# Patient Record
Sex: Male | Born: 1955 | Race: Black or African American | Hispanic: No | Marital: Single | Smoking: Current every day smoker
Health system: Southern US, Community
[De-identification: ages and names within clinical notes are randomized; demographics above are authoritative.]

## PROBLEM LIST (undated history)

## (undated) DIAGNOSIS — R339 Retention of urine, unspecified: Secondary | ICD-10-CM

## (undated) DIAGNOSIS — F17200 Nicotine dependence, unspecified, uncomplicated: Secondary | ICD-10-CM

## (undated) DIAGNOSIS — N138 Other obstructive and reflux uropathy: Secondary | ICD-10-CM

## (undated) DIAGNOSIS — R972 Elevated prostate specific antigen [PSA]: Secondary | ICD-10-CM

---

## 2004-10-03 ENCOUNTER — Emergency Department: Payer: Self-pay | Admitting: Emergency Medicine

## 2005-04-05 ENCOUNTER — Emergency Department: Payer: Self-pay | Admitting: Emergency Medicine

## 2005-12-21 ENCOUNTER — Emergency Department: Payer: Self-pay | Admitting: Emergency Medicine

## 2005-12-28 ENCOUNTER — Emergency Department: Payer: Self-pay | Admitting: Emergency Medicine

## 2006-01-03 ENCOUNTER — Emergency Department: Payer: Self-pay | Admitting: Emergency Medicine

## 2007-05-29 IMAGING — CR DG RIBS 2V*R*
1 series · 3 of 3 positions shown · non-contrast
Comparison: none

REASON FOR EXAM: RIGHT rib pain /injury
COMMENTS:

PROCEDURE:     DXR - DXR RIBS RIGHT UNILATERAL  - January 03, 2006  [DATE]
RESULT:          No evidence of displaced rib fracture or pneumothorax.

[Series 1: view not recorded · 0.17mm/px · 3 of 3 slices shown]
[im 1/3]
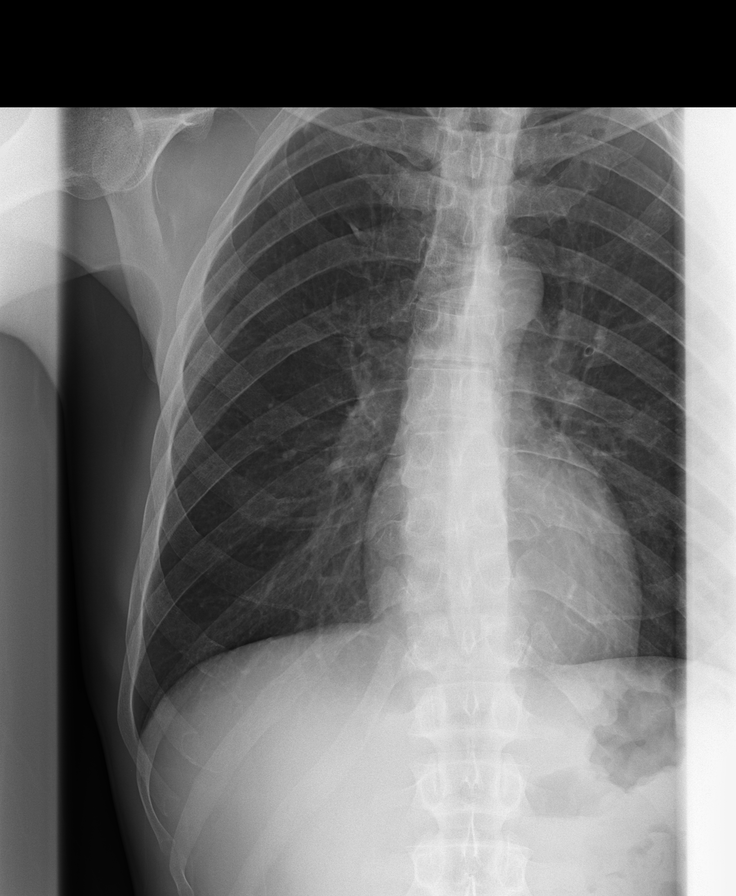
[im 2/3]
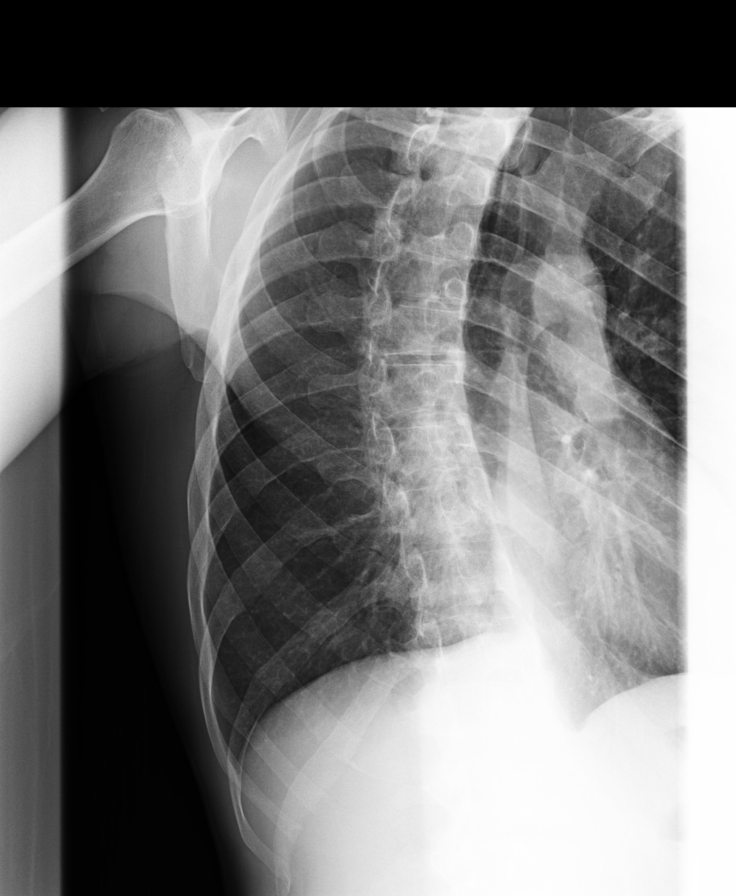
[im 3/3]
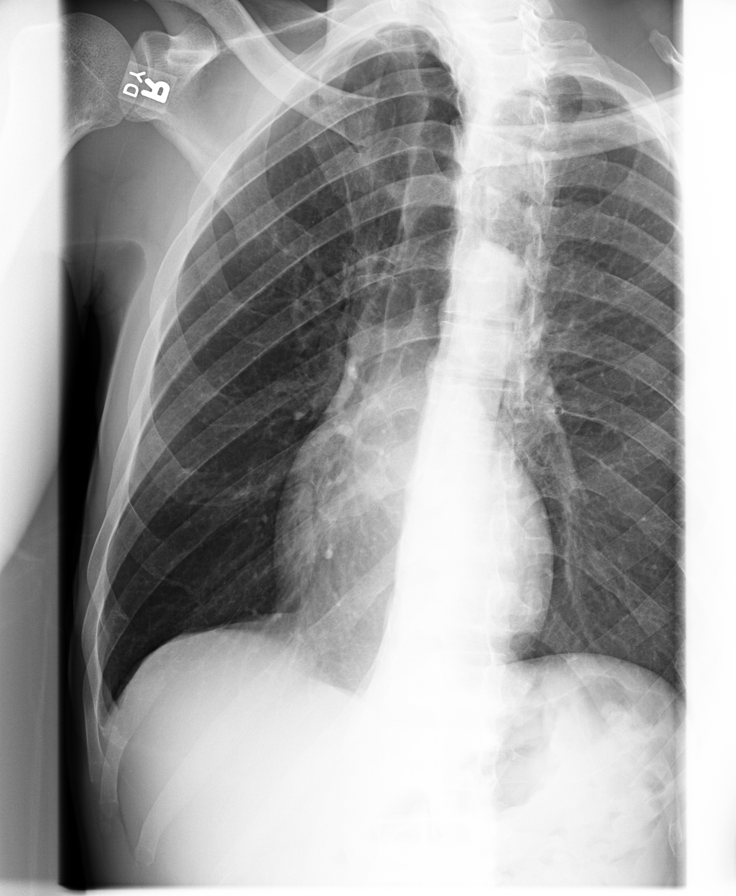

[3 of 3 positions shown; findings below may reference images not displayed]

IMPRESSION: No acute significant abnormality is identified.  No
evidence of displaced rib fracture or pneumothorax.

## 2015-07-17 ENCOUNTER — Emergency Department
Admission: EM | Admit: 2015-07-17 | Discharge: 2015-07-17 | Disposition: A | Discharge status: Home / Self Care | Payer: Self-pay | Attending: Emergency Medicine | Admitting: Emergency Medicine

## 2015-07-17 DIAGNOSIS — N611 Abscess of the breast and nipple: Secondary | ICD-10-CM

## 2015-07-17 DIAGNOSIS — L02213 Cutaneous abscess of chest wall: Secondary | ICD-10-CM | POA: Insufficient documentation

## 2015-07-17 DIAGNOSIS — Z720 Tobacco use: Secondary | ICD-10-CM | POA: Insufficient documentation

## 2015-07-17 MED ORDER — SULFAMETHOXAZOLE-TRIMETHOPRIM 800-160 MG PO TABS: 1.0000 | ORAL_TABLET | Freq: Two times a day (BID) | ORAL | Status: DC

## 2015-07-17 MED ORDER — IBUPROFEN 800 MG PO TABS: 800.0000 mg | ORAL_TABLET | Freq: Three times a day (TID) | ORAL | Status: AC | PRN

## 2015-07-17 MED ORDER — TRAMADOL HCL 50 MG PO TABS: 50.0000 mg | ORAL_TABLET | Freq: Four times a day (QID) | ORAL | Status: AC | PRN

## 2015-07-17 NOTE — Discharge Instructions (Signed)
Apply warm compresses to area twice a day.

## 2015-07-17 NOTE — ED Notes (Signed)
Pt c/o small red , raised swollen area to the right nipple for the past 2 days

## 2015-07-17 NOTE — ED Provider Notes (Signed)
[facility] Emergency Department Provider Note  ____________________________________________  Time seen: Approximately 12:55 PM  I have reviewed the triage vital signs and the nursing notes.   HISTORY  Chief Complaint Abscess    HPI Shane Fitzpatrick is a 59 y.o. male complaining of  small red swollen area to the right nipple for 2 days.Patient stated there is no discharge from this area. Stated dull pain. No provocative or palliative measures. Patient denies any fever or chills.   History reviewed. No pertinent past medical history.  There are no active problems to display for this patient.   History reviewed. No pertinent past surgical history.  No current outpatient prescriptions on file.  Allergies Review of patient's allergies indicates no known allergies.  No family history on file.  Social History History  Substance Use Topics  . Smoking status: Current Every Day Smoker -- 0.50 packs/day    Types: Cigarettes  . Smokeless tobacco: Never Used  . Alcohol Use: No    Review of Systems Constitutional: No fever/chills Eyes: No visual changes. ENT: No sore throat. Cardiovascular: Denies chest pain. Respiratory: Denies shortness of breath. Gastrointestinal: No abdominal pain.  No nausea, no vomiting.  No diarrhea.  No constipation. Genitourinary: Negative for dysuria. Musculoskeletal: Negative for back pain. Skin: Papular lesion left breast Neurological: Negative for headaches, focal weakness or numbness. 10-point ROS otherwise negative.  ____________________________________________   PHYSICAL EXAM:  VITAL SIGNS: ED Triage Vitals  Enc Vitals Group     BP 07/17/15 1137 160/84 mmHg     Pulse Rate 07/17/15 1137 72     Resp 07/17/15 1137 18     Temp 07/17/15 1137 98.2 F (36.8 C)     Temp Source 07/17/15 1137 Oral     SpO2 07/17/15 1137 99 %     Weight 07/17/15 1137 145 lb (65.772 kg)     Height 07/17/15 1137  (1.702 m)   Head Cir --      Peak Flow --      Pain Score 07/17/15 1138 0     Pain Loc --      Pain Edu? --      Excl. in GC? --     Constitutional: Alert and oriented. Well appearing and in no acute distress. Eyes: Conjunctivae are normal. PERRL. EOMI. Head: Atraumatic. Nose: No congestion/rhinnorhea. Mouth/Throat: Mucous membranes are moist.  Oropharynx non-erythematous. Neck: No stridor.  No cervical spine tenderness to palpation. Hematological/Lymphatic/Immunilogical: No cervical lymphadenopathy. Cardiovascular: Normal rate, regular rhythm. Grossly normal heart sounds.  Good peripheral circulation. Elevated systolic blood pressure. Respiratory: Normal respiratory effort.  No retractions. Lungs CTAB. Gastrointestinal: Soft and nontender. No distention. No abdominal bruits. No CVA tenderness. Musculoskeletal: No lower extremity tenderness nor edema.  No joint effusions. Neurologic:  Normal speech and language. No gross focal neurologic deficits are appreciated. No gait instability. Skin:  Erythematous papular lesion right breast. Area is nonfluctuant. Mild and induration.  Psychiatric: Mood and affect are normal. Speech and behavior are normal.  ____________________________________________   LABS (all labs ordered are listed, but only abnormal results are displayed)  Labs Reviewed - No data to display ____________________________________________  EKG   ____________________________________________  RADIOLOGY   ____________________________________________   PROCEDURES  Procedure(s) performed: None  Critical Care performed: No  ____________________________________________   INITIAL IMPRESSION / ASSESSMENT AND PLAN / ED COURSE  Pertinent labs & imaging results that were available during my care of the patient were reviewed by me and considered in my medical decision making (  see chart for details).  Abscess right breast.area is nonfluctuant at this time. Discussed the patient  rationale from the I&D at this time. Patient was placed on Bactrim tramadol and ibuprofen. Patient get advised on wound care. Patient advised return back in 3 days sooner if his condition worsens.  ____________________________________________   FINAL CLINICAL IMPRESSION(S) / ED DIAGNOSES  Final diagnoses:  Abscess of right breast      Joni Reining, PA-C 07/17/15 1305  Phineas Semen, MD 07/17/15 [phone]

## 2022-05-12 ENCOUNTER — Other Ambulatory Visit: Payer: Self-pay

## 2022-05-12 DIAGNOSIS — R1110 Vomiting, unspecified: Secondary | ICD-10-CM | POA: Diagnosis not present

## 2022-05-12 DIAGNOSIS — R109 Unspecified abdominal pain: Secondary | ICD-10-CM | POA: Insufficient documentation

## 2022-05-12 DIAGNOSIS — R300 Dysuria: Secondary | ICD-10-CM | POA: Diagnosis not present

## 2022-05-12 DIAGNOSIS — R339 Retention of urine, unspecified: Secondary | ICD-10-CM | POA: Insufficient documentation

## 2022-05-12 LAB — CBC
HCT: 42.6 % (ref 39.0–52.0)
Hemoglobin: 14.3 g/dL (ref 13.0–17.0)
MCH: 32.7 pg (ref 26.0–34.0)
MCHC: 33.6 g/dL (ref 30.0–36.0)
MCV: 97.5 fL (ref 80.0–100.0)
Platelets: 247 10*3/uL (ref 150–400)
RBC: 4.37 MIL/uL (ref 4.22–5.81)
RDW: 11.9 % (ref 11.5–15.5)
WBC: 12.9 10*3/uL — ABNORMAL HIGH (ref 4.0–10.5)
nRBC: 0 % (ref 0.0–0.2)

## 2022-05-12 LAB — COMPREHENSIVE METABOLIC PANEL
ALT: 15 U/L (ref 0–44)
AST: 26 U/L (ref 15–41)
Albumin: 4 g/dL (ref 3.5–5.0)
Alkaline Phosphatase: 74 U/L (ref 38–126)
Anion gap: 8 (ref 5–15)
BUN: 15 mg/dL (ref 8–23)
CO2: 22 mmol/L (ref 22–32)
Calcium: 9 mg/dL (ref 8.9–10.3)
Chloride: 108 mmol/L (ref 98–111)
Creatinine, Ser: 1.26 mg/dL — ABNORMAL HIGH (ref 0.61–1.24)
GFR, Estimated: >60 mL/min (ref >60)
Glucose, Bld: 154 mg/dL — ABNORMAL HIGH (ref 70–99)
Potassium: 3.5 mmol/L (ref 3.5–5.1)
Sodium: 138 mmol/L (ref 135–145)
Total Bilirubin: 1 mg/dL (ref 0.3–1.2)
Total Protein: 8.4 g/dL — ABNORMAL HIGH (ref 6.5–8.1)

## 2022-05-12 LAB — LIPASE, BLOOD: Lipase: 30 U/L (ref 11–51)

## 2022-05-12 MED ORDER — OXYCODONE-ACETAMINOPHEN 5-325 MG PO TABS
1.0000 | ORAL_TABLET | ORAL | Status: DC | PRN
  Administered 2022-05-12: 1 via ORAL
  Filled 2022-05-12: qty 1

## 2022-05-12 NOTE — ED Triage Notes (Signed)
Pt presents via POV c/o lower abd pain started last PM. Reports N/V. Reports difficultly urinating with dysuria.

## 2022-05-13 ENCOUNTER — Emergency Department
Admission: EM | Admit: 2022-05-13 | Discharge: 2022-05-13 | Disposition: A | Discharge status: Home / Self Care | Payer: No Typology Code available for payment source | Attending: Emergency Medicine | Admitting: Emergency Medicine

## 2022-05-13 DIAGNOSIS — R339 Retention of urine, unspecified: Secondary | ICD-10-CM

## 2022-05-13 LAB — URINALYSIS, ROUTINE W REFLEX MICROSCOPIC
Bilirubin Urine: NEGATIVE
Glucose, UA: NEGATIVE mg/dL
Hgb urine dipstick: NEGATIVE
Ketones, ur: NEGATIVE mg/dL
Leukocytes,Ua: NEGATIVE
Nitrite: NEGATIVE
Protein, ur: NEGATIVE mg/dL
Specific Gravity, Urine: 1.02 (ref 1.005–1.030)
pH: 5 (ref 5.0–8.0)

## 2022-05-13 MED ORDER — LIDOCAINE HCL URETHRAL/MUCOSAL 2 % EX GEL
1.0000 "application " | Freq: Once | CUTANEOUS | Status: AC
  Administered 2022-05-13: 1 via URETHRAL
  Filled 2022-05-13: qty 10

## 2022-05-13 NOTE — Discharge Instructions (Signed)
Take the tamsulosin once a day as prescribed.  Call urology in the morning for an appointment within the week.  Please follow the below instructions for your Foley catheter   Catheter care  Always wash your hands before and after touching your catheter.  Check the area around the urethra for inflammation or signs of infection. Signs of infection include irritated, swollen, red, or tender skin, or pus around the catheter.  Clean the area around the catheter with soap and water two times a day. Dry with a clean towel afterward.  Do not apply powder or lotion to the skin around the catheter.  To empty the urine collection bag  Wash your hands with soap and water.  Without touching the drain spout, remove the spout from its sleeve at the bottom of the collection bag. Open the valve on the spout.  Let the urine flow out of the bag and into the toilet or a container. Do not let the tubing or drain spout touch anything.  After you empty the bag, clean the end of the drain spout with tissue and water. Close the valve and put the drain spout back into its sleeve at the bottom of the collection bag.  Wash your hands with soap and water.

## 2022-05-13 NOTE — ED Notes (Signed)
Pt having lower abd pain with inability to urinate. Pt has been diaphoretic and vomiting. Pt's pain was 10/10. Post cath, pt has no pain.

## 2022-05-13 NOTE — ED Provider Notes (Signed)
[facility] Provider Note    Event Date/Time   First MD Initiated Contact with Patient 05/13/22 [phone]     (approximate)   History   Abdominal Pain   HPI  Shane Fitzpatrick is a 66 y.o. male who presents for evaluation of abdominal pain and urinary retention.  Patient started with dysuria and suprapubic abdominal pain yesterday.  Today has been unable to urinate.  Went to urgent care and was given Flomax and Levaquin without any blood work or urinalysis done.  Went home and started vomiting.  He is currently complaining of 10 out of 10 pain in the suprapubic region.  Denies chest pain or shortness of breath.  Patient has not seen a doctor in a very long time and therefore has no known medical problems.  No back pain, saddle anesthesia, lower extremity weakness or numbness   PMH None   Physical Exam   Triage Vital Signs: ED Triage Vitals  Enc Vitals Group     BP 05/12/22 2013 (!) 228/94     Pulse Rate 05/12/22 2013 72     Resp 05/12/22 2013 16     Temp 05/12/22 2013 98.8 F (37.1 C)     Temp Source 05/12/22 2013 Oral     SpO2 05/12/22 2013 99 %     Weight 05/12/22 2010 145 lb (65.8 kg)     Height 05/12/22 2010 5\' 9"  (1.753 m)     Head Circumference --      Peak Flow --      Pain Score 05/12/22 2010 7     Pain Loc --      Pain Edu? --      Excl. in GC? --     Most recent vital signs: Vitals:   05/13/22 0151 05/13/22 0153  BP:  (!) 160/83  Pulse:  72  Resp: 18 18  Temp: 98.1 F (36.7 C) 98.1 F (36.7 C)  SpO2:  100%     Constitutional: Alert and oriented. Well appearing and in no apparent distress. HEENT:      Head: Normocephalic and atraumatic.         Eyes: Conjunctivae are normal. Sclera is non-icteric.       Mouth/Throat: Mucous membranes are moist.       Neck: Supple with no signs of meningismus. Cardiovascular: Regular rate and rhythm. No murmurs, gallops, or rubs. 2+ symmetrical distal pulses are present in all extremities.   Respiratory: Normal respiratory effort. Lungs are clear to auscultation bilaterally.  Gastrointestinal: Soft, bladder is distended with tenderness on palpation, and non distended with positive bowel sounds. No rebound or guarding. Genitourinary: No CVA tenderness. Musculoskeletal:  No edema, cyanosis, or erythema of extremities. Neurologic: Normal speech and language. Face is symmetric. Moving all extremities. No gross focal neurologic deficits are appreciated. Skin: Skin is warm, dry and intact. No rash noted. Psychiatric: Mood and affect are normal. Speech and behavior are normal.  ED Results / Procedures / Treatments   Labs (all labs ordered are listed, but only abnormal results are displayed) Labs Reviewed  COMPREHENSIVE METABOLIC PANEL - Abnormal; Notable for the following components:      Result Value   Glucose, Bld 154 (*)    Creatinine, Ser 1.26 (*)    Total Protein 8.4 (*)    All other components within normal limits  CBC - Abnormal; Notable for the following components:   WBC 12.9 (*)    All other components within normal limits  URINALYSIS,  ROUTINE W REFLEX MICROSCOPIC - Abnormal; Notable for the following components:   Color, Urine YELLOW (*)    APPearance CLEAR (*)    All other components within normal limits  URINE CULTURE  LIPASE, BLOOD     EKG  none   RADIOLOGY none   PROCEDURES:  Critical Care performed: No  Procedures    IMPRESSION / MDM / ASSESSMENT AND PLAN / ED COURSE  I reviewed the triage vital signs and the nursing notes.  66 y.o. male who presents for evaluation of abdominal pain and urinary retention.  On exam patient is extremely hypertensive but otherwise no significant distress.  Bladder is distended on palpation.  Bladder scan showing 671 cc.  No prior history of urinary retention.  Patient did have some dysuria prior to retention.  Ddx: UTI versus BPH versus prostate cancer   Plan: Will place foley catheter. Will get CBC, BMP,  UA   MEDICATIONS GIVEN IN ED: Medications  oxyCODONE-acetaminophen (PERCOCET/ROXICET) 5-325 MG per tablet 1 tablet (1 tablet Oral Given 05/12/22 2024)  lidocaine (XYLOCAINE) 2 % jelly 1 application. (1 application. Urethral Given 05/13/22 0155)     ED COURSE: Bladder scan showing 671 cc.  A 16 French coud catheter was placed with decompression of the bladder.  Patient's pain and hypertension resolved.  UA with no evidence of UTI.  Patient has already received a prescription for Flomax from urgent care.  Recommended taking that every day.  We did discuss Foley care with patient.  Referral to urology in a week for further evaluation.  Discussed my standard return precautions with patient and both his daughters.  With no signs of UTI, sepsis, acute kidney injury there is no indication for admission   Consults: None   EMR reviewed none    FINAL CLINICAL IMPRESSION(S) / ED DIAGNOSES   Final diagnoses:  Urinary retention     Rx / DC Orders   ED Discharge Orders          Ordered    Ambulatory referral to Urology       Comments: New urinary retention now with a foley catheter, needs catheter removal and trial of void   05/13/22 0210             Note:  This document was prepared using Dragon voice recognition software and may include unintentional dictation errors.   Please note:  Patient was evaluated in Emergency Department today for the symptoms described in the history of present illness. Patient was evaluated in the context of the global COVID-19 pandemic, which necessitated consideration that the patient might be at risk for infection with the SARS-CoV-2 virus that causes COVID-19. Institutional protocols and algorithms that pertain to the evaluation of patients at risk for COVID-19 are in a state of rapid change based on information released by regulatory bodies including the CDC and federal and state organizations. These policies and algorithms were followed during the  patient's care in the ED.  Some ED evaluations and interventions may be delayed as a result of limited staffing during the pandemic.       Nita Sickle, MD 05/13/22 [phone]

## 2022-05-14 LAB — URINE CULTURE: Culture: NO GROWTH

## 2022-05-27 ENCOUNTER — Ambulatory Visit: Payer: Self-pay | Admitting: Urology

## 2022-05-27 NOTE — Progress Notes (Incomplete)
   05/27/22 5:33 AM   Laren Everts May 10, 1956 [id number]  Referring provider:  Nita Sickle, MD [address] [place],  [place] [address] No chief complaint on file.     HPI: Shane Fitzpatrick is a 66 y.o.malewho presents today for further evaluation of urinary retention   He was seen in the ED on 05/13/2022. He presented with abdominal pain and urinary retention. He went to urgent care and was prescribed Flomax and Levaquin. UA was unremarkable and urine culture showed no growth. His PVR showed 671 cc, he was cathter with a 16 FR coude with decompression.     PMH: No past medical history on file.  Surgical History: No past surgical history on file.  Home Medications:  Allergies as of 05/27/2022   No Known Allergies      Medication List        Accurate as of May 27, 2022  5:33 AM. If you have any questions, ask your nurse or doctor.          ibuprofen 800 MG tablet Commonly known as: ADVIL Take 1 tablet (800 mg total) by mouth every 8 (eight) hours as needed for moderate pain.   sulfamethoxazole-trimethoprim 800-160 MG tablet Commonly known as: BACTRIM DS Take 1 tablet by mouth 2 (two) times daily.        Allergies:  No Known Allergies  Family History: No family history on file.  Social History:  reports that he has been smoking cigarettes. He has been smoking an average of .5 packs per day. He has never used smokeless tobacco. He reports that he does not drink alcohol and does not use drugs.   Physical Exam: There were no vitals taken for this visit.  Constitutional:  Alert and oriented, No acute distress. HEENT: [place] AT, moist mucus membranes.  Trachea midline, no masses. Cardiovascular: No clubbing, cyanosis, or edema. Respiratory: Normal respiratory effort, no increased work of breathing. Skin: No rashes, bruises or suspicious lesions. Neurologic: Grossly intact, no focal deficits, moving all 4 extremities. Psychiatric: Normal mood  and affect.  Laboratory Data:  Lab Results  Component Value Date   CREATININE 1.26 (H) 05/12/2022   No results found for: "HGBA1C"  Urinalysis   Pertinent Imaging:    Assessment & Plan:     No follow-ups on file.  I,Kailey Littlejohn,acting as a Neurosurgeon for Vanna Scotland, MD.,have documented all relevant documentation on the behalf of Vanna Scotland, MD,as directed by  Vanna Scotland, MD while in the presence of Vanna Scotland, MD.   [facility] [address], [address] [place], [place] [address] [phone]

## 2022-05-30 ENCOUNTER — Encounter: Payer: Self-pay | Admitting: Urology

## 2022-06-04 NOTE — H&P (Signed)
NAMEDECLIN, RAJAN MEDICAL RECORD NO: [id number] ACCOUNT NO: [id number] DATE OF BIRTH: [birth year] FACILITY: [facility] LOCATION: [facility] PHYSICIAN: Suszanne Conners. Evelene Croon, MD  History and Physical   DATE OF ADMISSION: 06/12/2022  Same day surgery 07/06.  CHIEF COMPLAINT:  Urinary retention.  HISTORY OF PRESENT ILLNESS:  Mr. Ethington is a 66 year old African-American male who developed acute urinary retention and had a Foley catheter placed in the Emergency Room on 06/5th.  He was started on tamsulosin at that time.  He was given a voiding  trial, which was unsuccessful 1 week later and we increased his tamsulosin to 0.4 mg b.i.d. and added finasteride.  Since that time, he has failed 2 additional voiding trials and now comes in for photovaporization of prostate with GreenLight laser.  He  was found to have a 59.9 mL prostate by ultrasound.  He was also noted to have a PSA of 12.3 ng/mL on 06/13th.  PAST MEDICAL HISTORY: ALLERGIES:  No drug allergies.  CURRENT MEDICATIONS:  Tamsulosin and finasteride.  PAST SURGICAL HISTORY:  Negative.  PAST AND CURRENT MEDICAL CONDITIONS:  Negative.  REVIEW OF SYSTEMS:  The patient denies chest pain, shortness of breath, diabetes, stroke or heart disease.  SOCIAL HISTORY:  The patient smokes a pack a day and has a 40-pack-year history.  He denied alcohol use.  FAMILY HISTORY:  Father died at age 55 of unknown reasons.  Mother died at age 42 of unknown reasons.  He has a brother, age 68 with BPH.  There is no family history of prostate cancer.  PHYSICAL EXAMINATION: VITAL SIGNS:  Height was 5 feet 7 inches, weight 139 pounds, BMI 22. GENERAL:  Well-nourished African-American male in no acute distress. HEENT:  Sclerae were clear.  Pupils are equally round, reactive to light and accommodation.  Extraocular movements are intact. NECK:  No palpable cervical masses. LYMPHATICS:  No palpable cervical or inguinal adenopathy. PULMONARY:  Lungs clear to  auscultation. CARDIOVASCULAR:  Regular rhythm and rate. ABDOMEN:  Soft, nontender abdomen. GENITOURINARY:  Circumcised.  Testes smooth, nontender, approximately 18 mL size each. RECTAL EXAM: 50 gram, smooth, nontender prostate, otherwise unremarkable. NEUROMUSCULAR:  Nonfocal.  IMPRESSION: 1.  Urinary retention. 2.  Significant BPH with obstruction. 3.  Elevated PSA.  PLAN:  Photovaporization of prostate with GreenLight laser.   NIK D: 06/03/2022 4:59:27 pm T: 06/03/2022 6:50:00 pm  JOB: [id number]/ [id number]

## 2022-06-06 ENCOUNTER — Other Ambulatory Visit: Payer: Self-pay

## 2022-06-06 ENCOUNTER — Encounter
Admission: RE | Admit: 2022-06-06 | Discharge: 2022-06-06 | Disposition: A | Discharge status: Home / Self Care | Payer: No Typology Code available for payment source | Source: Ambulatory Visit | Attending: Urology | Admitting: Urology

## 2022-06-06 DIAGNOSIS — Z01812 Encounter for preprocedural laboratory examination: Secondary | ICD-10-CM

## 2022-06-06 HISTORY — DX: Elevated prostate specific antigen (PSA): R97.20

## 2022-06-06 HISTORY — DX: Retention of urine, unspecified: R33.9

## 2022-06-06 HISTORY — DX: Nicotine dependence, unspecified, uncomplicated: F17.200

## 2022-06-06 HISTORY — DX: Other obstructive and reflux uropathy: N13.8

## 2022-06-06 NOTE — Patient Instructions (Addendum)
Your procedure is scheduled on: 06/12/22 - Thursday Report to the Registration Desk on the 1st floor of the [facility]. To find out your arrival time, please call [phone] between 1PM - 3PM on: 06/11/22 - Wednesday If your arrival time is 6:00 am, do not arrive prior to that time as the [facility] entrance doors do not open until 6:00 am.  REMEMBER: Instructions that are not followed completely may result in serious medical risk, up to and including death; or upon the discretion of your surgeon and anesthesiologist your surgery may need to be rescheduled.  Do not eat food after midnight the night before surgery.  No gum chewing, lozengers or hard candies.  You may however, drink CLEAR liquids up to 2 hours before you are scheduled to arrive for your surgery. Do not drink anything within 2 hours of your scheduled arrival time.  Clear liquids include: - water  - apple juice without pulp - gatorade (not RED colors) - black coffee or tea (Do NOT add milk or creamers to the coffee or tea) Do NOT drink anything that is not on this list.  TAKE THESE MEDICATIONS THE MORNING OF SURGERY WITH A SIP OF WATER: NONE  One week prior to surgery: Stop Anti-inflammatories (NSAIDS) such as Advil, Aleve, Ibuprofen, Motrin, Naproxen, Naprosyn and Aspirin based products such as Excedrin, Goodys Powder, BC Powder.  Stop ANY OVER THE COUNTER supplements until after surgery.  You may to take Tylenol if needed for pain up until the day of surgery.  No Alcohol for 24 hours before or after surgery.  No Smoking including e-cigarettes for 24 hours prior to surgery.  No chewable tobacco products for at least 6 hours prior to surgery.  No nicotine patches on the day of surgery.  Do not use any "recreational" drugs for at least a week prior to your surgery.  Please be advised that the combination of cocaine and anesthesia may have negative outcomes, up to and including death. If you test positive for  cocaine, your surgery will be cancelled.  On the morning of surgery brush your teeth with toothpaste and water, you may rinse your mouth with mouthwash if you wish. Do not swallow any toothpaste or mouthwash.  Do not wear jewelry, make-up, hairpins, clips or nail polish.  Do not wear lotions, powders, or perfumes.   Do not shave body from the neck down 48 hours prior to surgery just in case you cut yourself which could leave a site for infection.  Also, freshly shaved skin may become irritated if using the CHG soap.  Contact lenses, hearing aids and dentures may not be worn into surgery.  Do not bring valuables to the hospital. [facility] is not responsible for any missing/lost belongings or valuables.   Notify your doctor if there is any change in your medical condition (cold, fever, infection).  Wear comfortable clothing (specific to your surgery type) to the hospital.  After surgery, you can help prevent lung complications by doing breathing exercises.  Take deep breaths and cough every 1-2 hours. Your doctor may order a device called an Incentive Spirometer to help you take deep breaths. When coughing or sneezing, hold a pillow firmly against your incision with both hands. This is called "splinting." Doing this helps protect your incision. It also decreases belly discomfort.  If you are being admitted to the hospital overnight, leave your suitcase in the car. After surgery it may be brought to your room.  If you are  being discharged the day of surgery, you will not be allowed to drive home. You will need a responsible adult (18 years or older) to drive you home and stay with you that night.   If you are taking public transportation, you will need to have a responsible adult (18 years or older) with you. Please confirm with your physician that it is acceptable to use public transportation.   Please call the [facility] at [phone] if you have any questions  about these instructions.  Surgery Visitation Policy:  Patients undergoing a surgery or procedure may have two family members or support persons with them as long as the person is not COVID-19 positive or experiencing its symptoms.   Inpatient Visitation:    Visiting hours are 7 a.m. to 8 p.m. Up to four visitors are allowed at one time in a patient room, including children. The visitors may rotate out with other people during the day. One designated support person (adult) may remain overnight.

## 2022-06-11 MED ORDER — FAMOTIDINE 20 MG PO TABS: 20.0000 mg | ORAL_TABLET | Freq: Once | ORAL | Status: AC

## 2022-06-11 MED ORDER — ORAL CARE MOUTH RINSE: 15.0000 mL | Freq: Once | OROMUCOSAL | Status: AC

## 2022-06-11 MED ORDER — LACTATED RINGERS IV SOLN: INTRAVENOUS | Status: DC

## 2022-06-11 MED ORDER — CEFAZOLIN SODIUM-DEXTROSE 1-4 GM/50ML-% IV SOLN
1.0000 g | Freq: Once | INTRAVENOUS | Status: AC
  Administered 2022-06-12: 1 g via INTRAVENOUS

## 2022-06-11 MED ORDER — CHLORHEXIDINE GLUCONATE 0.12 % MT SOLN: 15.0000 mL | Freq: Once | OROMUCOSAL | Status: AC

## 2022-06-12 ENCOUNTER — Ambulatory Visit
Admission: RE | Admit: 2022-06-12 | Discharge: 2022-06-12 | Disposition: A | Discharge status: Home / Self Care | Payer: No Typology Code available for payment source | Attending: Urology | Admitting: Urology

## 2022-06-12 ENCOUNTER — Ambulatory Visit: Payer: No Typology Code available for payment source | Admitting: Urgent Care

## 2022-06-12 ENCOUNTER — Encounter: Payer: Self-pay | Admitting: Urology

## 2022-06-12 ENCOUNTER — Other Ambulatory Visit: Payer: Self-pay

## 2022-06-12 ENCOUNTER — Encounter
Admission: RE | Disposition: A | Discharge status: Home / Self Care | Payer: Self-pay | Source: Home / Self Care | Attending: Urology

## 2022-06-12 DIAGNOSIS — F17210 Nicotine dependence, cigarettes, uncomplicated: Secondary | ICD-10-CM | POA: Diagnosis not present

## 2022-06-12 DIAGNOSIS — R338 Other retention of urine: Secondary | ICD-10-CM | POA: Insufficient documentation

## 2022-06-12 DIAGNOSIS — Z01812 Encounter for preprocedural laboratory examination: Secondary | ICD-10-CM

## 2022-06-12 DIAGNOSIS — N401 Enlarged prostate with lower urinary tract symptoms: Secondary | ICD-10-CM | POA: Insufficient documentation

## 2022-06-12 DIAGNOSIS — Z79899 Other long term (current) drug therapy: Secondary | ICD-10-CM | POA: Diagnosis not present

## 2022-06-12 DIAGNOSIS — N138 Other obstructive and reflux uropathy: Secondary | ICD-10-CM

## 2022-06-12 DIAGNOSIS — Z01810 Encounter for preprocedural cardiovascular examination: Secondary | ICD-10-CM

## 2022-06-12 HISTORY — PX: GREEN LIGHT LASER TURP (TRANSURETHRAL RESECTION OF PROSTATE: SHX6260

## 2022-06-12 SURGERY — GREEN LIGHT LASER TURP (TRANSURETHRAL RESECTION OF PROSTATE
Anesthesia: General | Site: Prostate

## 2022-06-12 MED ORDER — PHENYLEPHRINE 80 MCG/ML (10ML) SYRINGE FOR IV PUSH (FOR BLOOD PRESSURE SUPPORT)
PREFILLED_SYRINGE | INTRAVENOUS | Status: DC | PRN
  Administered 2022-06-12 (×3): 80 ug via INTRAVENOUS

## 2022-06-12 MED ORDER — PROPOFOL 10 MG/ML IV BOLUS
INTRAVENOUS | Status: AC
  Filled 2022-06-12: qty 20

## 2022-06-12 MED ORDER — ONDANSETRON HCL 4 MG/2ML IJ SOLN
INTRAMUSCULAR | Status: DC | PRN
  Administered 2022-06-12: 4 mg via INTRAVENOUS

## 2022-06-12 MED ORDER — DEXAMETHASONE SODIUM PHOSPHATE 10 MG/ML IJ SOLN
INTRAMUSCULAR | Status: AC
  Filled 2022-06-12: qty 2

## 2022-06-12 MED ORDER — LIDOCAINE HCL URETHRAL/MUCOSAL 2 % EX GEL
CUTANEOUS | Status: AC
Start: 2022-06-12 — End: ?
  Filled 2022-06-12: qty 10

## 2022-06-12 MED ORDER — PROPOFOL 10 MG/ML IV BOLUS
INTRAVENOUS | Status: DC | PRN
  Administered 2022-06-12: 125 mg via INTRAVENOUS

## 2022-06-12 MED ORDER — LABETALOL HCL 5 MG/ML IV SOLN
INTRAVENOUS | Status: AC
  Filled 2022-06-12: qty 4

## 2022-06-12 MED ORDER — LIDOCAINE HCL URETHRAL/MUCOSAL 2 % EX GEL
CUTANEOUS | Status: DC | PRN
  Administered 2022-06-12: 1 via URETHRAL

## 2022-06-12 MED ORDER — SUGAMMADEX SODIUM 200 MG/2ML IV SOLN
INTRAVENOUS | Status: DC | PRN
  Administered 2022-06-12: 200 mg via INTRAVENOUS

## 2022-06-12 MED ORDER — FENTANYL CITRATE (PF) 100 MCG/2ML IJ SOLN
INTRAMUSCULAR | Status: DC | PRN
  Administered 2022-06-12: 50 ug via INTRAVENOUS
  Administered 2022-06-12: 25 ug via INTRAVENOUS
  Administered 2022-06-12: 50 ug via INTRAVENOUS
  Administered 2022-06-12: 25 ug via INTRAVENOUS
  Administered 2022-06-12: 50 ug via INTRAVENOUS

## 2022-06-12 MED ORDER — ACETAMINOPHEN 10 MG/ML IV SOLN
INTRAVENOUS | Status: AC
  Filled 2022-06-12: qty 100

## 2022-06-12 MED ORDER — CEFAZOLIN SODIUM-DEXTROSE 1-4 GM/50ML-% IV SOLN
INTRAVENOUS | Status: AC
  Filled 2022-06-12: qty 50

## 2022-06-12 MED ORDER — KETOROLAC TROMETHAMINE 30 MG/ML IJ SOLN
30.0000 mg | Freq: Once | INTRAMUSCULAR | Status: DC
Start: 2022-06-12 — End: 2022-06-12

## 2022-06-12 MED ORDER — ROCURONIUM BROMIDE 100 MG/10ML IV SOLN
INTRAVENOUS | Status: DC | PRN
  Administered 2022-06-12: 50 mg via INTRAVENOUS
  Administered 2022-06-12: 30 mg via INTRAVENOUS
  Administered 2022-06-12: 20 mg via INTRAVENOUS

## 2022-06-12 MED ORDER — CHLORHEXIDINE GLUCONATE 0.12 % MT SOLN
OROMUCOSAL | Status: AC
  Administered 2022-06-12: 15 mL via OROMUCOSAL
  Filled 2022-06-12: qty 15

## 2022-06-12 MED ORDER — URIBEL 118 MG PO CAPS: 1.0000 | ORAL_CAPSULE | Freq: Four times a day (QID) | ORAL | 3 refills | Status: AC | PRN

## 2022-06-12 MED ORDER — DEXAMETHASONE SODIUM PHOSPHATE 10 MG/ML IJ SOLN
INTRAMUSCULAR | Status: DC | PRN
  Administered 2022-06-12: 10 mg via INTRAVENOUS

## 2022-06-12 MED ORDER — GLYCOPYRROLATE 0.2 MG/ML IJ SOLN
INTRAMUSCULAR | Status: AC
  Filled 2022-06-12: qty 1

## 2022-06-12 MED ORDER — MIDAZOLAM HCL 2 MG/2ML IJ SOLN
INTRAMUSCULAR | Status: AC
  Filled 2022-06-12: qty 2

## 2022-06-12 MED ORDER — GLYCOPYRROLATE 0.2 MG/ML IJ SOLN
INTRAMUSCULAR | Status: DC | PRN
  Administered 2022-06-12: .2 mg via INTRAVENOUS

## 2022-06-12 MED ORDER — FENTANYL CITRATE (PF) 100 MCG/2ML IJ SOLN
INTRAMUSCULAR | Status: AC
  Filled 2022-06-12: qty 2

## 2022-06-12 MED ORDER — MIDAZOLAM HCL 2 MG/2ML IJ SOLN
INTRAMUSCULAR | Status: DC | PRN
  Administered 2022-06-12: 2 mg via INTRAVENOUS

## 2022-06-12 MED ORDER — KETAMINE HCL 50 MG/5ML IJ SOSY
PREFILLED_SYRINGE | INTRAMUSCULAR | Status: AC
  Filled 2022-06-12: qty 5

## 2022-06-12 MED ORDER — LIDOCAINE HCL (CARDIAC) PF 100 MG/5ML IV SOSY
PREFILLED_SYRINGE | INTRAVENOUS | Status: DC | PRN
  Administered 2022-06-12: 80 mg via INTRAVENOUS

## 2022-06-12 MED ORDER — DOCUSATE SODIUM 100 MG PO CAPS: 200.0000 mg | ORAL_CAPSULE | Freq: Two times a day (BID) | ORAL | 3 refills | Status: AC

## 2022-06-12 MED ORDER — LIDOCAINE HCL (PF) 2 % IJ SOLN
INTRAMUSCULAR | Status: AC
  Filled 2022-06-12: qty 10

## 2022-06-12 MED ORDER — SODIUM CHLORIDE 0.9 % IR SOLN
Status: DC | PRN
  Administered 2022-06-12: 3000 mL via INTRAVESICAL
  Administered 2022-06-12: 3000 mL
  Administered 2022-06-12: 1000 mL
  Administered 2022-06-12: 3000 mL
  Administered 2022-06-12: 3000 mL via INTRAVESICAL
  Administered 2022-06-12: 3000 mL
  Administered 2022-06-12 (×2): 3000 mL via INTRAVESICAL

## 2022-06-12 MED ORDER — KETAMINE HCL 10 MG/ML IJ SOLN
INTRAMUSCULAR | Status: DC | PRN
  Administered 2022-06-12: 10 mg via INTRAVENOUS
  Administered 2022-06-12 (×2): 20 mg via INTRAVENOUS

## 2022-06-12 MED ORDER — FENTANYL CITRATE (PF) 100 MCG/2ML IJ SOLN
25.0000 ug | INTRAMUSCULAR | Status: DC | PRN
  Administered 2022-06-12 (×2): 25 ug via INTRAVENOUS

## 2022-06-12 MED ORDER — ONDANSETRON HCL 4 MG/2ML IJ SOLN
INTRAMUSCULAR | Status: AC
  Filled 2022-06-12: qty 4

## 2022-06-12 MED ORDER — KETOROLAC TROMETHAMINE 30 MG/ML IJ SOLN
INTRAMUSCULAR | Status: AC
  Filled 2022-06-12: qty 1

## 2022-06-12 MED ORDER — DEXMEDETOMIDINE (PRECEDEX) IN NS 20 MCG/5ML (4 MCG/ML) IV SYRINGE
PREFILLED_SYRINGE | INTRAVENOUS | Status: DC | PRN
  Administered 2022-06-12 (×3): 4 ug via INTRAVENOUS
  Administered 2022-06-12: 8 ug via INTRAVENOUS

## 2022-06-12 MED ORDER — ONDANSETRON HCL 4 MG/2ML IJ SOLN: 4.0000 mg | Freq: Once | INTRAMUSCULAR | Status: DC | PRN

## 2022-06-12 MED ORDER — LABETALOL HCL 5 MG/ML IV SOLN
INTRAVENOUS | Status: DC | PRN
  Administered 2022-06-12 (×2): 2.5 mg via INTRAVENOUS

## 2022-06-12 MED ORDER — ACETAMINOPHEN 10 MG/ML IV SOLN
INTRAVENOUS | Status: DC | PRN
  Administered 2022-06-12: 1000 mg via INTRAVENOUS

## 2022-06-12 MED ORDER — PHENYLEPHRINE 80 MCG/ML (10ML) SYRINGE FOR IV PUSH (FOR BLOOD PRESSURE SUPPORT)
PREFILLED_SYRINGE | INTRAVENOUS | Status: AC
  Filled 2022-06-12: qty 10

## 2022-06-12 MED ORDER — LEVOFLOXACIN 500 MG PO TABS: 500.0000 mg | ORAL_TABLET | Freq: Every day | ORAL | 0 refills | Status: AC

## 2022-06-12 MED ORDER — FAMOTIDINE 20 MG PO TABS
ORAL_TABLET | ORAL | Status: AC
  Administered 2022-06-12: 20 mg via ORAL
  Filled 2022-06-12: qty 1

## 2022-06-12 MED ORDER — ROCURONIUM BROMIDE 10 MG/ML (PF) SYRINGE
PREFILLED_SYRINGE | INTRAVENOUS | Status: AC
  Filled 2022-06-12: qty 10

## 2022-06-12 SURGICAL SUPPLY — 26 items
ADAPTER IRRIG TUBE 2 SPIKE SOL (ADAPTER) ×4 IMPLANT
BAG URINE DRAIN 2000ML AR STRL (UROLOGICAL SUPPLIES) ×2 IMPLANT
CATH FOLEY 2WAY SIL 20X30 (CATHETERS) ×1 IMPLANT
CYSTOSCOPE CON FLOW (MISCELLANEOUS) ×2 IMPLANT
FEE RENTAL LASER GREENLIGHT (Laser) ×1 IMPLANT
GAUZE 4X4 16PLY ~~LOC~~+RFID DBL (SPONGE) ×3 IMPLANT
GLOVE BIO SURGEON STRL SZ7.5 (GLOVE) ×2 IMPLANT
GOWN STRL REUS W/ TWL LRG LVL3 (GOWN DISPOSABLE) ×1 IMPLANT
GOWN STRL REUS W/ TWL XL LVL3 (GOWN DISPOSABLE) ×1 IMPLANT
GOWN STRL REUS W/TWL LRG LVL3 (GOWN DISPOSABLE) ×1
GOWN STRL REUS W/TWL XL LVL3 (GOWN DISPOSABLE) ×1
IV NS 1000ML (IV SOLUTION) ×1
IV NS 1000ML BAXH (IV SOLUTION) ×1 IMPLANT
IV NS IRRIG 3000ML ARTHROMATIC (IV SOLUTION) ×10 IMPLANT
IV SET PRIMARY 15D 139IN B9900 (IV SETS) ×2 IMPLANT
KIT TURNOVER CYSTO (KITS) ×2 IMPLANT
LASER FIBER /GREENLIGHT LASER (Laser) ×2 IMPLANT
LASER GREENLIGHT RENTAL P/PROC (Laser) ×2 IMPLANT
MoXy Liquid Cooled Laser Fiber ×1 IMPLANT
PACK CYSTO AR (MISCELLANEOUS) ×2 IMPLANT
SET IRRIG Y TYPE TUR BLADDER L (SET/KITS/TRAYS/PACK) ×2 IMPLANT
SET Y ADAPTER MULIT-BAG IRRIG (MISCELLANEOUS) ×1 IMPLANT
SURGILUBE 2OZ TUBE FLIPTOP (MISCELLANEOUS) ×2 IMPLANT
SYR TOOMEY IRRIG 70ML (MISCELLANEOUS) ×2
SYRINGE TOOMEY IRRIG 70ML (MISCELLANEOUS) ×1 IMPLANT
WATER STERILE IRR 1000ML POUR (IV SOLUTION) ×3 IMPLANT

## 2022-06-12 NOTE — Progress Notes (Signed)
Bloody urine , thick with clots, foley irrigated with saline per MD, urine clear at this time

## 2022-06-12 NOTE — Brief Op Note (Signed)
06/12/2022  12:36 PM  PATIENT:  Shane Fitzpatrick  66 y.o. male  PRE-OPERATIVE DIAGNOSIS:  ENLARGED PROSTATE ELEV PSA URINARY RETENTION  POST-OPERATIVE DIAGNOSIS:  ENLARGED PROSTATE ELEV PSA URINARY RETENTION  PROCEDURE:  Procedure(s): GREEN LIGHT LASER TURP (TRANSURETHRAL RESECTION OF PROSTATE (N/A)  SURGEON:  Surgeon(s) and Role:    Orson Ape, MD - Primary  PHYSICIAN ASSISTANT:   ASSISTANTS: none   ANESTHESIA:   general  EBL:  20 mL   BLOOD ADMINISTERED:none  DRAINS: Foley  LOCAL MEDICATIONS USED: Uro-Jet  SPECIMEN: None   DISPOSITION OF SPECIMEN: None  COUNTS:  YES  TOURNIQUET:  * No tourniquets in log *  DICTATION: .Dragon Dictation  PLAN OF CARE: Discharge to home after PACU  PATIENT DISPOSITION:  PACU - hemodynamically stable.   Delay start of Pharmacological VTE agent (>24hrs) due to surgical blood loss or risk of bleeding: not applicable

## 2022-06-12 NOTE — Anesthesia Procedure Notes (Signed)
Procedure Name: Intubation Date/Time: 06/12/2022 10:31 AM  Performed by: Joanette Gula, Yvonna Brun, CRNAPre-anesthesia Checklist: Patient identified, Emergency Drugs available, Suction available and Patient being monitored Patient Re-evaluated:Patient Re-evaluated prior to induction Oxygen Delivery Method: Circle system utilized Preoxygenation: Pre-oxygenation with 100% oxygen Induction Type: IV induction Ventilation: Mask ventilation without difficulty Laryngoscope Size: McGraph and 4 Grade View: Grade II Tube type: Oral Number of attempts: 1 Airway Equipment and Method: Stylet Placement Confirmation: ETT inserted through vocal cords under direct vision, positive ETCO2 and breath sounds checked- equal and bilateral Secured at: 23 cm Tube secured with: Tape Dental Injury: Teeth and Oropharynx as per pre-operative assessment

## 2022-06-12 NOTE — Transfer of Care (Signed)
Immediate Anesthesia Transfer of Care Note  Patient: Shane Fitzpatrick  Procedure(s) Performed: GREEN LIGHT LASER TURP (TRANSURETHRAL RESECTION OF PROSTATE (Prostate)  Patient Location: PACU  Anesthesia Type:General  Level of Consciousness: drowsy  Airway & Oxygen Therapy: Patient Spontanous Breathing and Patient connected to face mask oxygen  Post-op Assessment: Report given to RN and Post -op Vital signs reviewed and stable  Post vital signs: Reviewed and stable  Last Vitals:  Vitals Value Taken Time  BP 183/110 06/12/22 1245  Temp    Pulse 74 06/12/22 1247  Resp 17 06/12/22 1247  SpO2 100 % 06/12/22 1247  Vitals shown include unvalidated device data.  Last Pain:  Vitals:   06/12/22 0848  TempSrc: Temporal  PainSc: 0-No pain         Complications: No notable events documented.

## 2022-06-12 NOTE — Progress Notes (Signed)
Demonstrated to pt and 2 daughters how to irrigate foley and change from big drainage bag to leg beg.  Daughters returned demonstration.  Instructed pt how to perform daily peri care.  Pt and daughters stated understanding and all questions were answered with full satisfaction.

## 2022-06-12 NOTE — Op Note (Signed)
Preoperative diagnosis: 1.  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  Urinary retention (R33.9)  Postoperative diagnosis: Same  Procedure: Photo vaporization of the prostate with greenlight laser (CPT [id number])  Surgeon: Suszanne Conners. Evelene Croon MD  Anesthesia: General  Indications:See the history and physical also. 66 year old (DATE OF BIRTH: January 02, 1956) African-American male who developed acute urinary retention and had a Foley catheter placed in the Emergency Room on 05/12/22.  He was started on tamsulosin at that time.  He was given a voiding trial, which was unsuccessful 1 week later and we increased his tamsulosin to 0.4 mg b.i.d. and added finasteride. Since that time, he has failed 2 additional voiding trials and now comes in for photovaporization of prostate with GreenLight laser.  He was found to have a 59.9 mL prostate by ultrasound.After informed consent the above procedure(s) were requested     Technique and findings: After adequate general anesthesia was obtained the patient was placed into dorsal lithotomy position and the perineum was prepped and draped in usual fashion.  The 21 French laser scope was matched to the camera and visually advanced into the bladder.  The bladder was heavily trabeculated with cellules and diverticuli present.  No bladder tumors were identified.  Trilobar BPH was identified with minimal intravesical growth of the median lobe.  Both ureteral orifices were identified and had clear efflux.  The greenlight XPS laser fiber was introduced and placed at 80 W of power.  The bladder neck tissue and median lobe was then vaporized.  The power was increased to 120 W and obstructive tissue from the lateral neck to the verumontanum was vaporized.  The power was further increased to 180 W and remaining obstructive tissue vaporized.  Bleeders were then controlled with the coagulative setting.  The laser scope was then removed and 10 cc of  viscous Xylocaine instilled within the urethra and bladder.  A 20 French silicone catheter was placed and irrigated until clear.  Blood loss was minimal.  The procedure was then terminated and patient transferred to the recovery room in stable condition.

## 2022-06-12 NOTE — Discharge Instructions (Addendum)
Shane Fitzpatrick Laser Prostate Treatment, Care After The following information offers guidance on how to care for yourself after your procedure. Your health care provider may also give you more specific instructions. If you have problems or questions, contact your health care provider. What can I expect after the procedure? After the procedure, it is common to have these symptoms for a few days: Swelling and discomfort around your urethra. Blood in your urine. A burning feeling when you urinate after the urinary catheter is removed. You will feel this especially at the end of urination. This feeling usually passes within 3-5 days. For the first few weeks after the procedure, you may still feel: A sudden need to urinate (urgency). A need to urinate often. Follow these instructions at home: Medicines Take over-the-counter and prescription medicines only as told by your health care provider. These medicines may include stool softeners. If you were prescribed an antibiotic medicine, take it as told by your health care provider. Do not stop taking the antibiotic even if you start to feel better. Bathing Do not take baths, swim, or use a hot tub until your health care provider approves. Ask your health care provider if you may take showers. You may only be allowed to take sponge baths. Activity A sign showing that a person should not drive.   Rest as told by your health care provider. Do not drive or operate machinery until your health care provider says that it is safe. Do not ride in a car for long periods of time, or as told by your health care provider. Do not do strenuous exercises for 1 week or as told by your health care provider. Strenuous exercises are exercises that require a lot of effort. Do not lift anything that is heavier than 10 lb (4.5 kg), or the limit that you are told, until your health care provider says that it is safe. Avoid sex for 4-6 weeks, or as told by your health care  provider. Return to your normal activities as told by your health care provider. Ask your health care provider what activities are safe for you. Preventing constipation You may need to take these actions to prevent or treat constipation: Drink enough fluid to keep your urine pale yellow. Take over-the-counter or prescription medicines. Eat foods that are high in fiber, such as beans, whole grains, and fresh fruits and vegetables. Limit foods that are high in fat and processed sugars, such as fried or sweet foods. General instructions Three cups showing dark yellow, yellow, and pale yellow urine.   Do not strain when you have a bowel movement. Straining may lead to bleeding from the prostate. This may cause blood clots and trouble urinating. Do not use any products that contain nicotine or tobacco. These products include cigarettes, chewing tobacco, and vaping devices, such as e-cigarettes. If you need help quitting, ask your health care provider. If you have a urinary catheter, care for it as told by your health care provider. Keep all follow-up visits. This is important. Contact a health care provider if: You have signs of infection, such as: Fever or chills. Swelling around your urethra that is getting worse. Urine that smells very bad. Struggling to urinate or pain or burning when you urinate. You have trouble having a bowel movement. You have blood in your urine for more than 2 days after the procedure. You have trouble having or keeping an erection. No semen comes out during orgasm (dry ejaculation). You have a urinary catheter still  in place and you have: Spasms or pain. Problems with the catheter or your catheter is blocked. Get help right away if: You cannot urinate after your catheter is removed. Your urine is dark red or has blood clots in it. You have blood in your stool. You have severe pain that does not get better with medicine. You develop swelling or pain in your  leg. You develop chest pains or shortness of breath. These symptoms may be an emergency. Get help right away. Call 911. Do not wait to see if the symptoms will go away. Do not drive yourself to the hospital. Summary After the procedure, it is common to have swelling and discomfort around your urethra and blood in your urine for a few days. Some men may have problems urinating after this procedure. These problems should go away after a few days. If you have pain or burning while urinating, contact your health care provider. If you have a catheter after this procedure, care for it as told by your health care provider. If you have severe pain, dark red urine, or urine with blood clots, get medical help right away. This information is not intended to replace advice given to you by your health care provider. Make sure you discuss any questions you have with your health care provider. Document Revised: 08/16/2021 Document Reviewed: 08/16/2021 Elsevier Patient Education  2023 [employer]   Indwelling Urinary Catheter Care, Adult An indwelling urinary catheter is a thin tube that is put into your bladder. The tube helps to drain pee (urine) out of your body. The tube goes in through your urethra. Your urethra is where pee comes out of your body. Your pee will come out through the catheter, then it will go into a bag (drainage bag). Take good care of your catheter so it will work well. What are the risks? Germs may get into your bladder and cause an infection. The tube can become blocked. Tissue near the catheter may become irritated and may bleed. How to wear your catheter and drainage bag Supplies needed Sticky tape (adhesive tape) or a leg strap. Alcohol wipe or soap and water (if you use tape). A clean towel (if you use tape). Large overnight bag. Smaller bag (leg bag). Wearing your catheter Attach your catheter to your leg with tape or a leg strap. Make sure the catheter is not pulled  tight. If a leg strap gets wet, take it off and put on a dry strap. If you use tape to hold the bag on your leg: Use an alcohol wipe or soap and water to wash your skin where the tape made it sticky before. Use a clean towel to pat-dry that skin. Use new tape to make the bag stay on your leg. Wearing your bags You should have been given a large overnight bag. You may wear the overnight bag in the day or night. Always have the overnight bag lower than your bladder.  Do not let the bag touch the floor. Before you go to sleep, put a clean plastic bag in a wastebasket. Then, hang the overnight bag inside the wastebasket. You should also have a smaller leg bag that fits under your clothes. Wear the leg bag as told by the product maker. This may be above or below the knee, depending on the length of the tubing. Make sure that the leg bag is below the bladder. Make sure that the tubing does not have loops or too much tension. Do not wear  your leg bag at night. How to care for your skin and catheter Supplies needed A clean washcloth. Water and mild soap. A clean towel. Caring for your skin and catheter Male anatomy showing the labia, urethra, and an indwelling urinary catheter in the bladder.     Male anatomy showing the penis, urethra, and an indwelling urinary catheter in the bladder.   Clean the skin around your catheter every day. Wash your hands with soap and water. Wet a clean washcloth in warm water and mild soap. Clean the skin around your urethra. If you are male: Gently spread the folds of skin around your vagina (labia). With the washcloth in your other hand, wipe the inner side of your labia on each side. Wipe from front to back. If you are male: Pull back any skin that covers the end of your penis (foreskin). With the washcloth in your other hand, wipe your penis in small circles. Start wiping at the tip of your penis, then move away from the catheter. Move the  foreskin back in place, if needed. With your free hand, hold the catheter close to where it goes into your body. Keep holding the catheter during cleaning so it does not get pulled out. With the washcloth in your other hand, clean the catheter. Only wipe downward on the catheter, toward the drainage bag. Do not wipe upward toward your body. Doing this may push germs into your urethra and cause infection. Use a clean towel to pat-dry the catheter and the skin around it. Make sure to wipe off all soap. Wash your hands with soap and water. Shower every day. Do not take baths. Do not use cream, ointment, or lotion on the area where the catheter goes into your body, unless your doctor tells you to. Do not use powders, sprays, or lotions on your genital area. Check your skin around the catheter every day for signs of infection. Check for: Redness, swelling, or pain. Fluid or blood. Warmth. Pus or a bad smell. How to empty the bag Supplies needed Rubbing alcohol. Gauze pad or cotton ball. Tape or a leg strap. Emptying the bag Pour the pee out of your bag when it is ?- full, or at least 2-3 times a day. Do this for your overnight bag and your leg bag. Wash your hands with soap and water. Separate (detach) the bag from your leg. Hold the bag over the toilet or a clean pail. Keep the bag lower than your hips and bladder. This is so the pee (urine) does not go back into the tube. Open the pour spout. It is at the bottom of the bag. Empty the pee into the toilet or pail. Do not let the pour spout touch any surface. Put rubbing alcohol on a gauze pad or cotton ball. Use the gauze pad or cotton ball to clean the pour spout. Close the pour spout. Attach the bag to your leg with tape or a leg strap. Wash your hands with soap and water. Follow instructions for cleaning the drainage bag. Instructions can come from: The product maker. Your doctor. How to change the bag Changing the bag Replace  your bag when it starts to leak, smell bad, or look dirty. Wash your hands with soap and water. Separate the dirty bag from your leg. Pinch the catheter with your fingers so that pee does not spill out. Separate the catheter tube from the bag tube where these tubes connect (at the connection valve). Do not let  the tubes touch any surface. Clean the end of the catheter tube with an alcohol wipe. Use a different alcohol wipe to clean the end of the bag tube. Connect the catheter tube to the tube of the clean bag. Attach the clean bag to your leg with tape or a leg strap. Do not make the bag tight on your leg. Wash your hands with soap and water. General instructions A person washing hands with soap and water.   Never pull on your catheter. Never try to take it out. Doing that can hurt you. Always wash your hands before and after you touch your catheter or bag. Use a mild, fragrance-free soap. If you do not have soap and water, use hand sanitizer. Always make sure there are no twists, bends, or kinks in the catheter tube. Always make sure there are no leaks in the catheter or bag. Drink enough fluid to keep your pee pale yellow. Do not take baths, swim, or use a hot tub. If you are male, wipe from front to back after you poop (have a bowel movement). Contact a doctor if: Your catheter gets clogged. Your catheter leaks. You have signs of infection at the catheter site, such as: Redness, swelling, or pain where the catheter goes into your body. Fluid, blood, pus, or a bad smell coming from the area where the catheter goes into your body. Skin feels warm where the catheter goes into your body. You have signs of a bladder infection, such as: Fever. Chills. Pee smells worse than usual. Cloudy pee. Pain in your belly, legs, lower back, or bladder. Vomiting or feel like vomiting. Get help right away if: You see blood in the catheter. Your pee is pink or red. Your bladder feels  full. Your pee is not draining into the bag. Your catheter gets pulled out. Summary An indwelling urinary catheter is a thin tube that is placed into the bladder to help drain pee (urine) out of the body. The catheter is placed into the part of the body that drains pee from the bladder (urethra). Taking good care of your catheter will keep it working well. Always wash your hands before and after touching your catheter or bag. Never pull on your catheter or try to take it out. This information is not intended to replace advice given to you by your health care provider. Make sure you discuss any questions you have with your health care provider. Document Revised: 07/25/2021 Document Reviewed: 07/25/2021 Elsevier Patient Education  2023 [employer]   AMBULATORY SURGERY  DISCHARGE INSTRUCTIONS   The drugs that you were given will stay in your system until tomorrow so for the next 24 hours you should not:  Drive an automobile Make any legal decisions Drink any alcoholic beverage   You may resume regular meals tomorrow.  Today it is better to start with liquids and gradually work up to solid foods.  You may eat anything you prefer, but it is better to start with liquids, then soup and crackers, and gradually work up to solid foods.   Please notify your doctor immediately if you have any unusual bleeding, trouble breathing, redness and pain at the surgery site, drainage, fever, or pain not relieved by medication.    Additional Instructions:  Please contact your physician with any problems or Same Day Surgery at [phone], Monday through Friday 6 am to 4 pm, or [place] [facility] Main number at [phone].        

## 2022-06-12 NOTE — Anesthesia Preprocedure Evaluation (Signed)
Anesthesia Evaluation  Patient identified by MRN, date of birth, ID band Patient awake    Reviewed: Allergy & Precautions, NPO status , Patient's Chart, lab work & pertinent test results  Airway Mallampati: III  TM Distance: >3 FB Neck ROM: Full    Dental  (+) Chipped, Poor Dentition,    Pulmonary neg pulmonary ROS, COPD, Current Smoker and Patient abstained from smoking.,    Pulmonary exam normal  + decreased breath sounds      Cardiovascular Exercise Tolerance: Good negative cardio ROS Normal cardiovascular exam Rhythm:Regular     Neuro/Psych negative neurological ROS  negative psych ROS   GI/Hepatic negative GI ROS, Neg liver ROS,   Endo/Other  negative endocrine ROS  Renal/GU negative Renal ROS  negative genitourinary   Musculoskeletal negative musculoskeletal ROS (+)   Abdominal Normal abdominal exam  (+)   Peds negative pediatric ROS (+)  Hematology negative hematology ROS (+)   Anesthesia Other Findings Past Medical History: No date: BPH with urinary obstruction No date: Current smoker No date: Elevated PSA No date: Urinary retention  History reviewed. No pertinent surgical history.  BMI    Body Mass Index: 22.71 kg/m      Reproductive/Obstetrics negative OB ROS                             Anesthesia Physical Anesthesia Plan  ASA: 2  Anesthesia Plan: General   Post-op Pain Management:    Induction: Intravenous  PONV Risk Score and Plan: Ondansetron, Dexamethasone, Midazolam and Treatment may vary due to age or medical condition  Airway Management Planned: Oral ETT  Additional Equipment:   Intra-op Plan:   Post-operative Plan: Extubation in OR  Informed Consent: I have reviewed the patients History and Physical, chart, labs and discussed the procedure including the risks, benefits and alternatives for the proposed anesthesia with the patient or authorized  representative who has indicated his/her understanding and acceptance.     Dental Advisory Given  Plan Discussed with: CRNA and Surgeon  Anesthesia Plan Comments:         Anesthesia Quick Evaluation

## 2022-06-12 NOTE — H&P (Signed)
Date of Initial H&P: 06/03/22  History reviewed, patient examined, no change in status, stable for surgery.

## 2022-06-13 ENCOUNTER — Encounter: Payer: Self-pay | Admitting: Urology

## 2022-06-13 NOTE — Anesthesia Postprocedure Evaluation (Signed)
Anesthesia Post Note  Patient: Shane Fitzpatrick  Procedure(s) Performed: GREEN LIGHT LASER TURP (TRANSURETHRAL RESECTION OF PROSTATE (Prostate)  Patient location during evaluation: PACU Anesthesia Type: General Level of consciousness: awake and oriented Pain management: satisfactory to patient Vital Signs Assessment: post-procedure vital signs reviewed and stable Respiratory status: spontaneous breathing and nonlabored ventilation Cardiovascular status: stable Anesthetic complications: no   No notable events documented.   Last Vitals:  Vitals:   06/12/22 1345 06/12/22 1405  BP: (!) 183/92 (!) 178/88  Pulse: 72 61  Resp: (!) 22 17  Temp: 37 C 36.7 C  SpO2: 97% 100%    Last Pain:  Vitals:   06/12/22 1405  TempSrc: Temporal  PainSc: 6                  VAN STAVEREN,Korah Hufstedler
